# Patient Record
Sex: Male | Born: 1970 | Race: White | Hispanic: No | Marital: Married | Smoking: Former smoker
Health system: Southern US, Community
[De-identification: ages and names within clinical notes are randomized; demographics above are authoritative.]

---

## 2010-01-10 ENCOUNTER — Ambulatory Visit: Payer: Self-pay | Admitting: Internal Medicine

## 2011-10-03 IMAGING — US US RENAL KIDNEY
1 series · 17 of 25 positions shown · non-contrast
Comparison: none

REASON FOR EXAM: hematuria
COMMENTS:

[Series 1: us renal kidney · 17 of 26 slices shown]
[im 1/26]
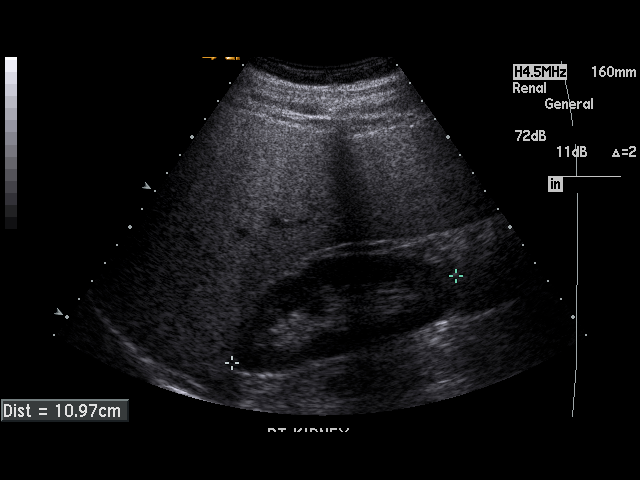
[im 3/26]
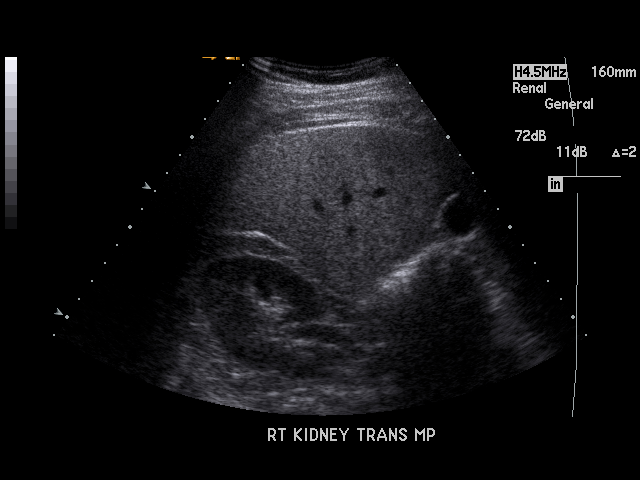
[im 4/26]
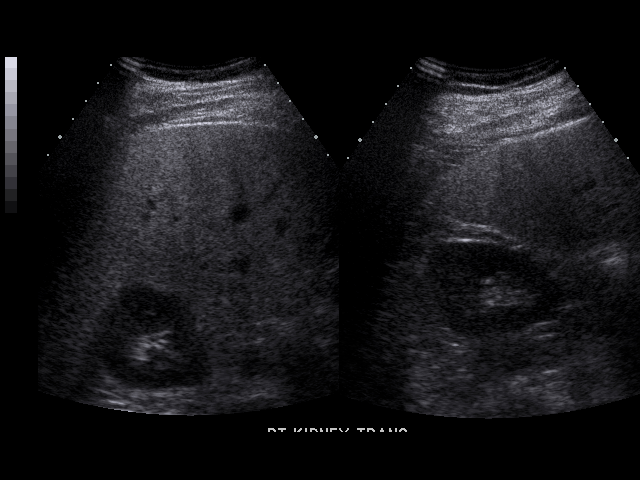
[im 6/26]
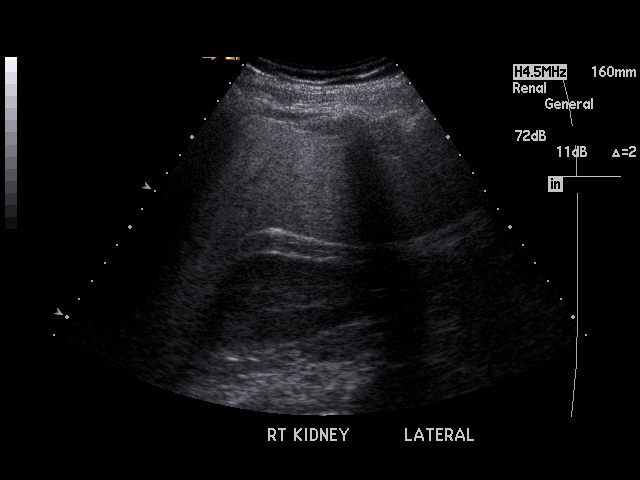
[im 7/26]
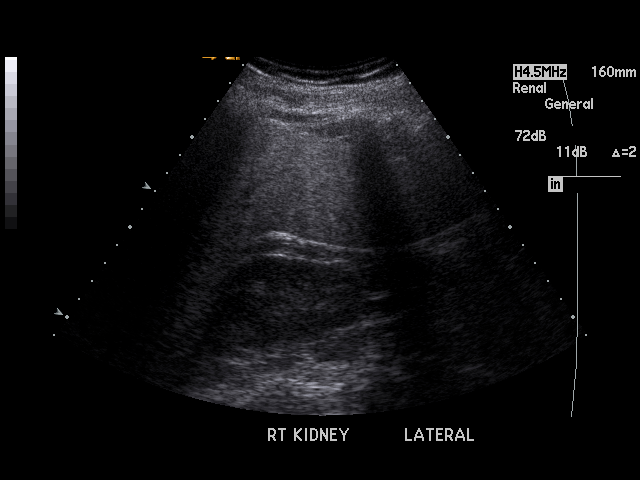
[im 9/26]
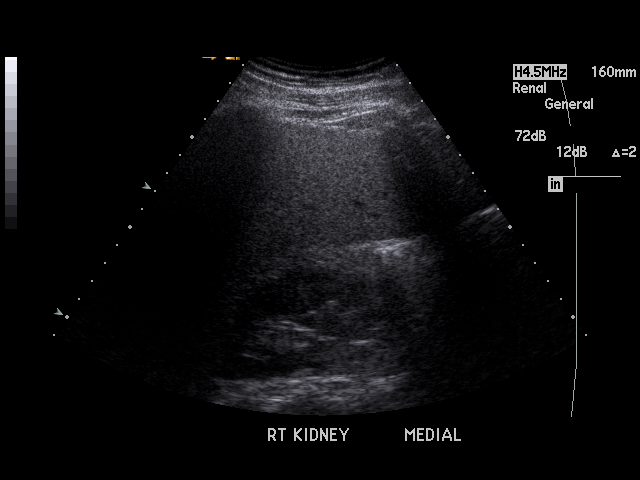
[im 10/26]
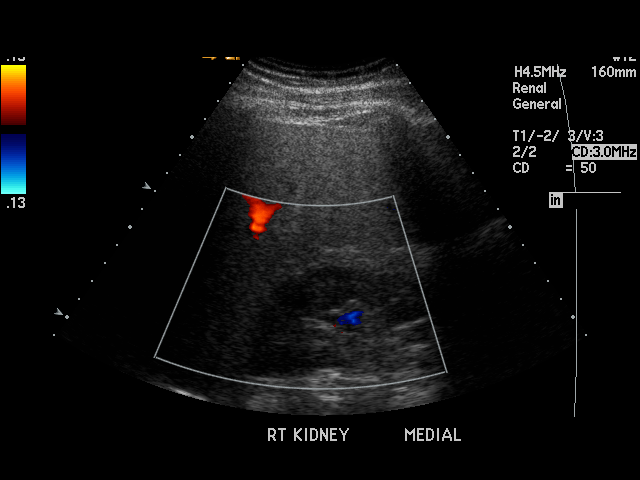
[im 12/26]
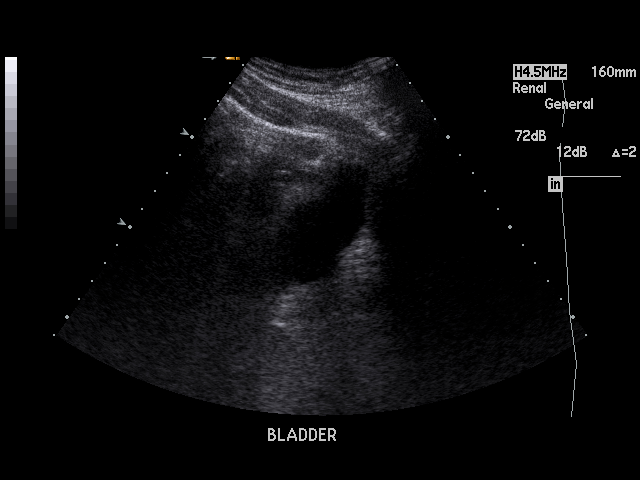
[im 13/26]
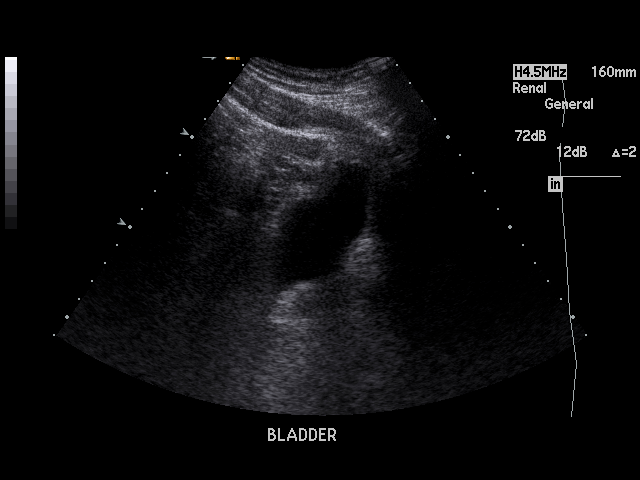
[im 14/26]
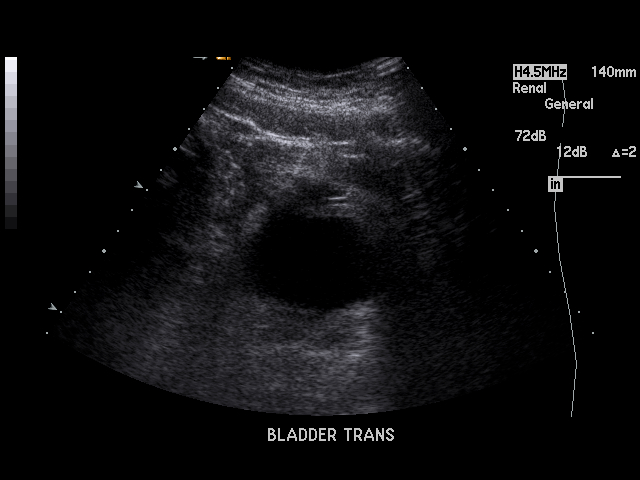
[im 16/26]
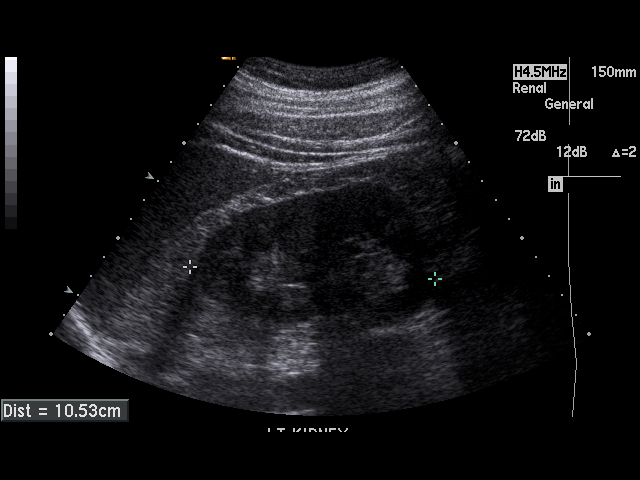
[im 17/26]
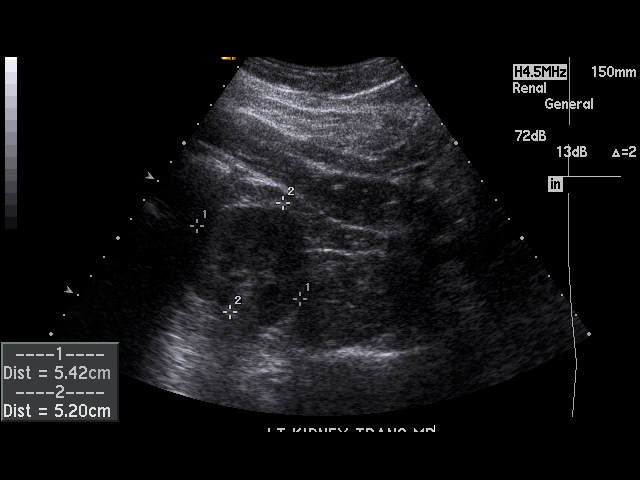
[im 19/26]
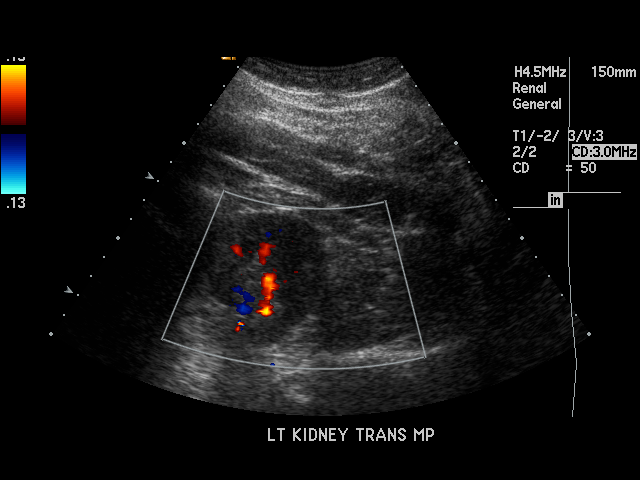
[im 20/26]
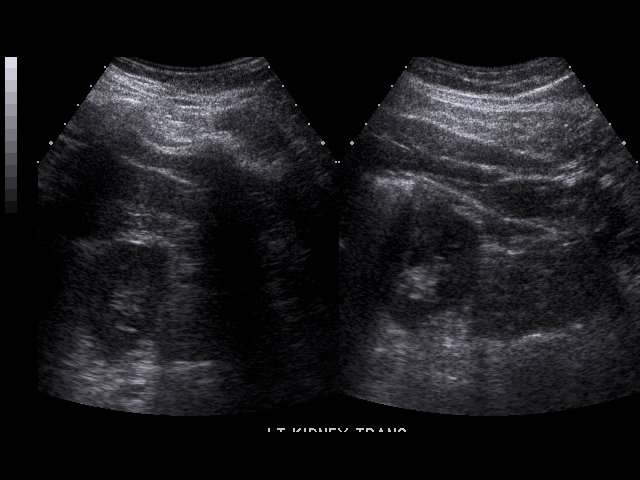
[im 22/26]
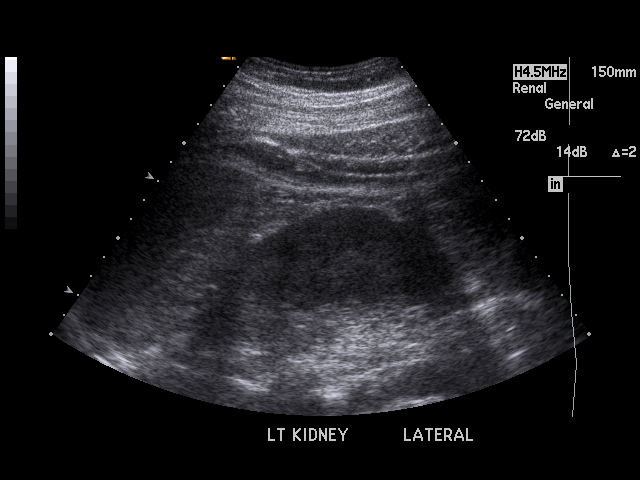
[im 23/26]
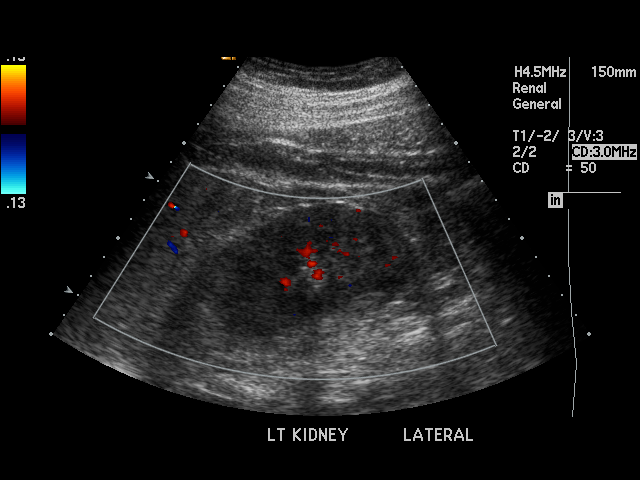
[im 26/26]
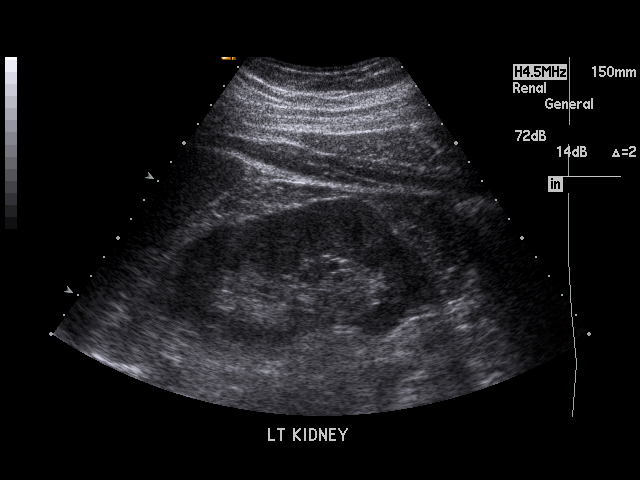

[17 of 25 positions shown; findings below may reference images not displayed]

PROCEDURE:     US  - US KIDNEY  - January 10, 2010  [DATE]

RESULT:     The right kidney measures 10.97 cm x 4.99 cm x 5.86 cm the left
kidney measures 10.53 cm x 5.42 cm 5.2 cm. The renal cortical margins
bilaterally are smooth. No solid or cystic renal mass lesions are seen. No
renal calcifications are noted. There is no hydronephrosis. The visualized
portion of the partially filled urinary bladder reveals no significant
abnormalities.
IMPRESSION: 1.     No significant abnormalities are noted.

## 2012-04-28 ENCOUNTER — Ambulatory Visit: Payer: Self-pay | Admitting: Surgery

## 2012-04-28 LAB — BASIC METABOLIC PANEL
Calcium, Total: 8.8 mg/dL (ref 8.5–10.1)
Co2: 29 mmol/L (ref 21–32)
Creatinine: 1.23 mg/dL (ref 0.60–1.30)
Glucose: 102 mg/dL — ABNORMAL HIGH (ref 65–99)
Osmolality: 277 (ref 275–301)
Potassium: 5 mmol/L (ref 3.5–5.1)
Sodium: 138 mmol/L (ref 136–145)

## 2012-04-28 LAB — CBC
HCT: 40.7 % (ref 40.0–52.0)
HGB: 13.9 g/dL (ref 13.0–18.0)
MCH: 29.8 pg (ref 26.0–34.0)
MCHC: 34.2 g/dL (ref 32.0–36.0)
MCV: 87 fL (ref 80–100)
RBC: 4.68 10*6/uL (ref 4.40–5.90)
RDW: 13.1 % (ref 11.5–14.5)
WBC: 8.7 10*3/uL (ref 3.8–10.6)

## 2012-05-04 ENCOUNTER — Ambulatory Visit: Payer: Self-pay | Admitting: Surgery

## 2013-01-13 ENCOUNTER — Emergency Department: Payer: Self-pay | Admitting: Emergency Medicine

## 2013-01-18 ENCOUNTER — Emergency Department: Payer: Self-pay | Admitting: Emergency Medicine

## 2013-04-16 ENCOUNTER — Ambulatory Visit: Payer: Self-pay

## 2014-10-06 IMAGING — CR MANDIBLE - 1-3 VIEW
1 series · 6 of 6 positions shown · non-contrast
Comparison: none

REASON FOR EXAM: laceration   kicked by a horse  flex 43
COMMENTS:   LMP: (Male)

[Series 1: w mandible pa · 0.14mm/px · 6 of 6 slices shown]
[im 1/6]
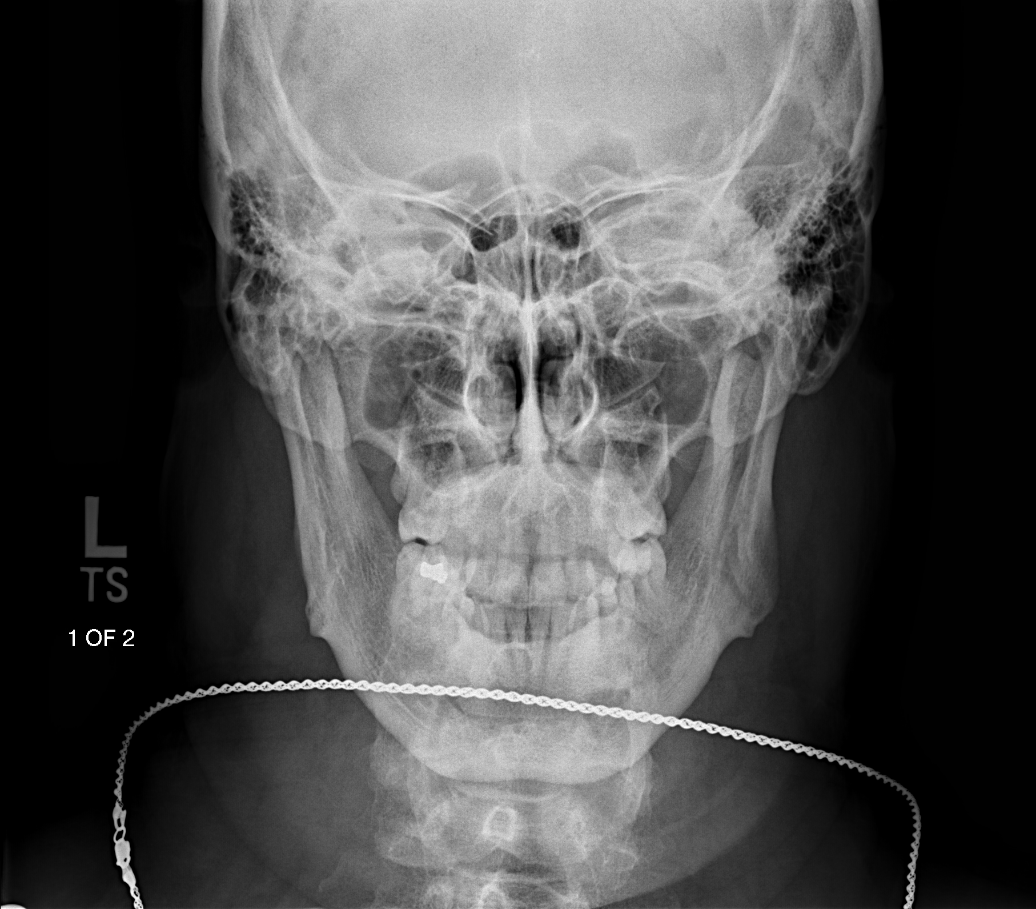
[im 2/6]
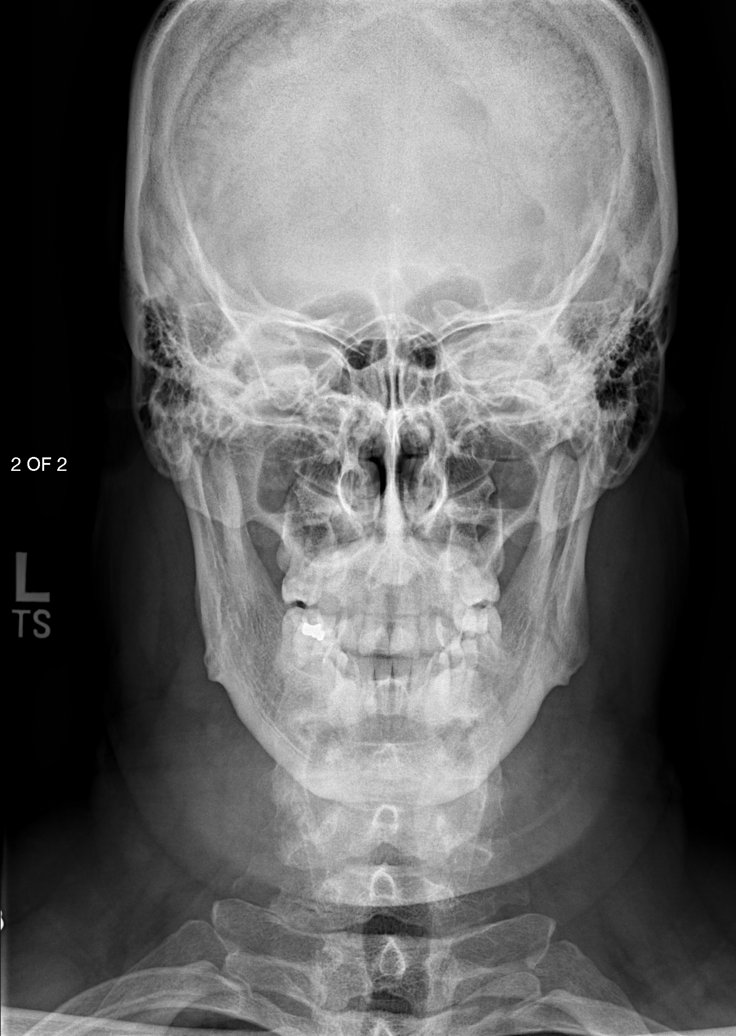
[im 3/6]
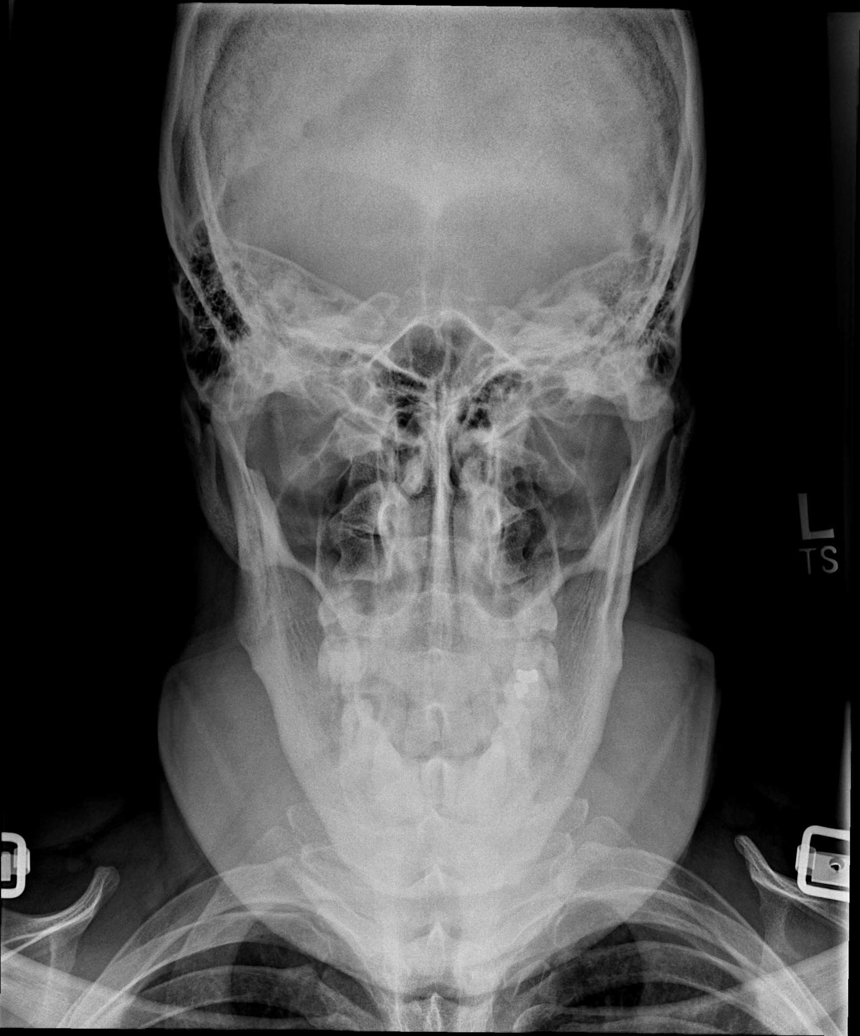
[im 4/6]
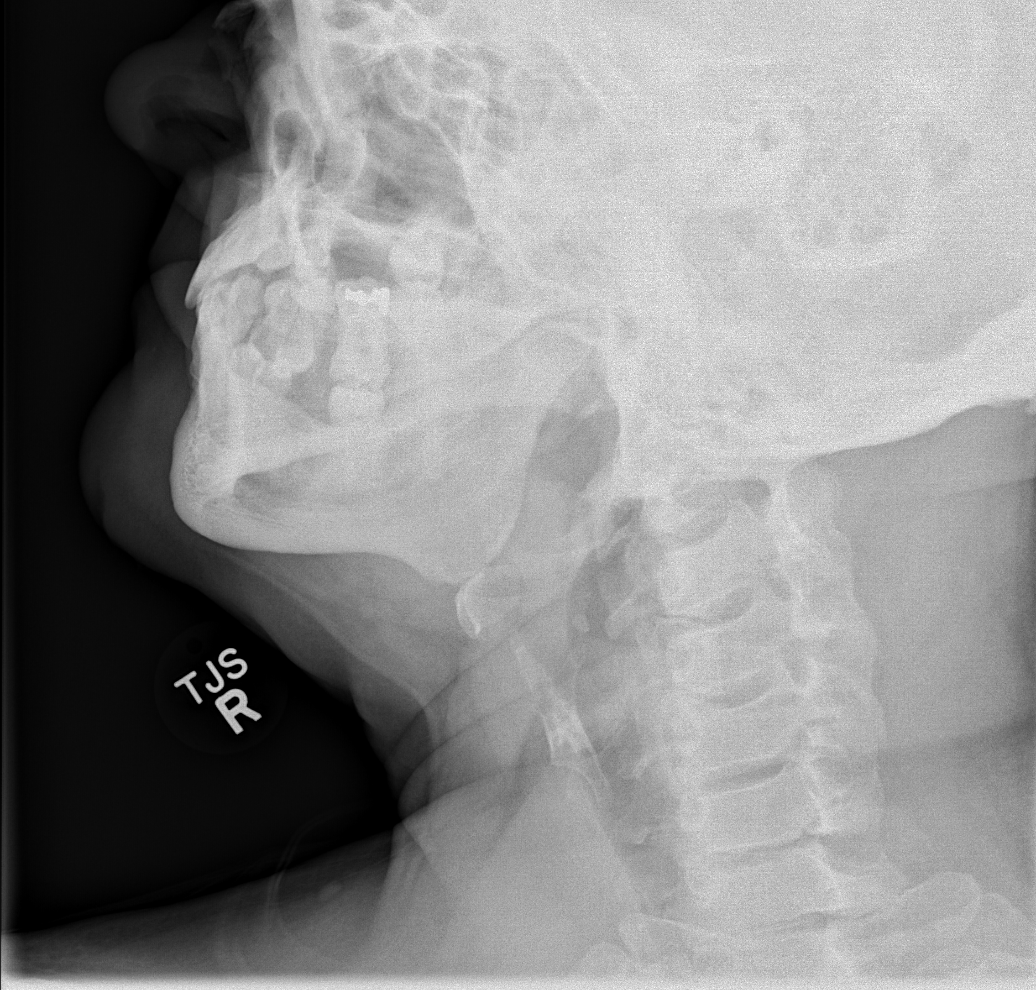
[im 5/6]
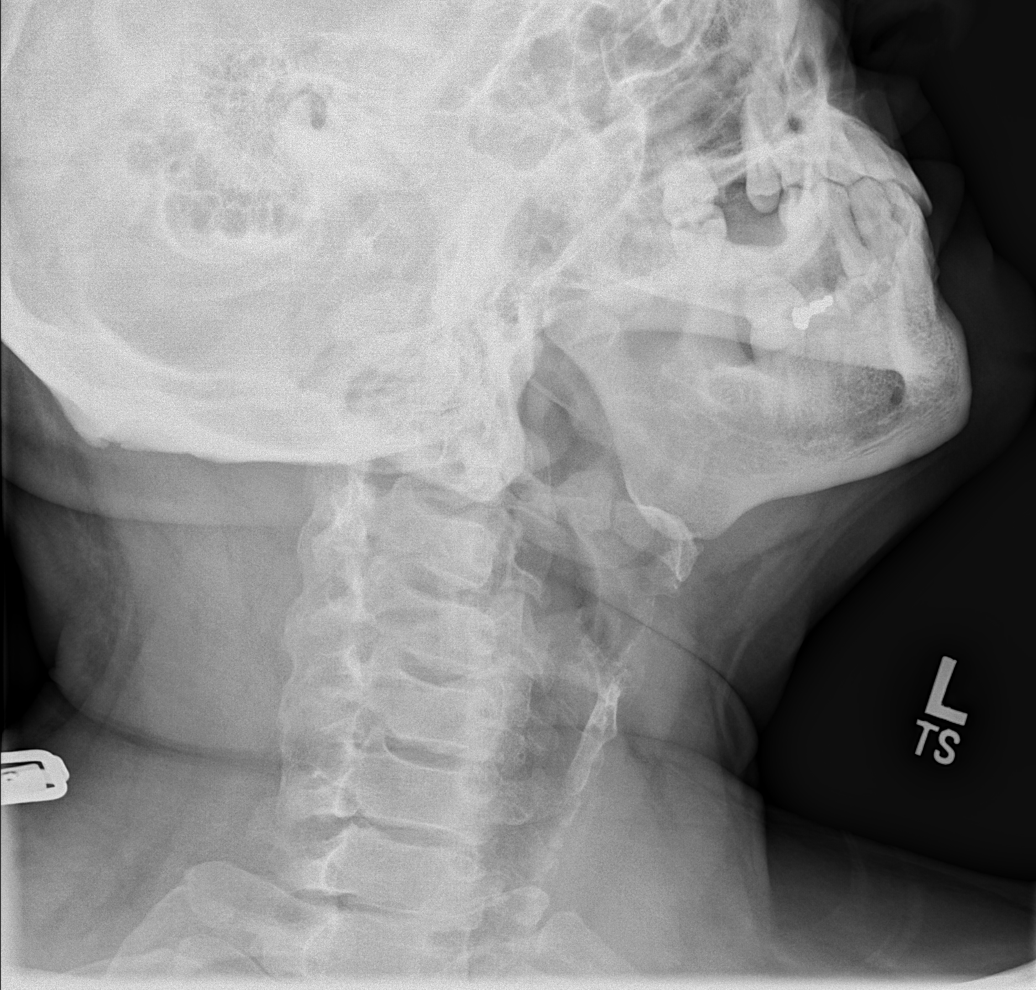
[im 6/6]
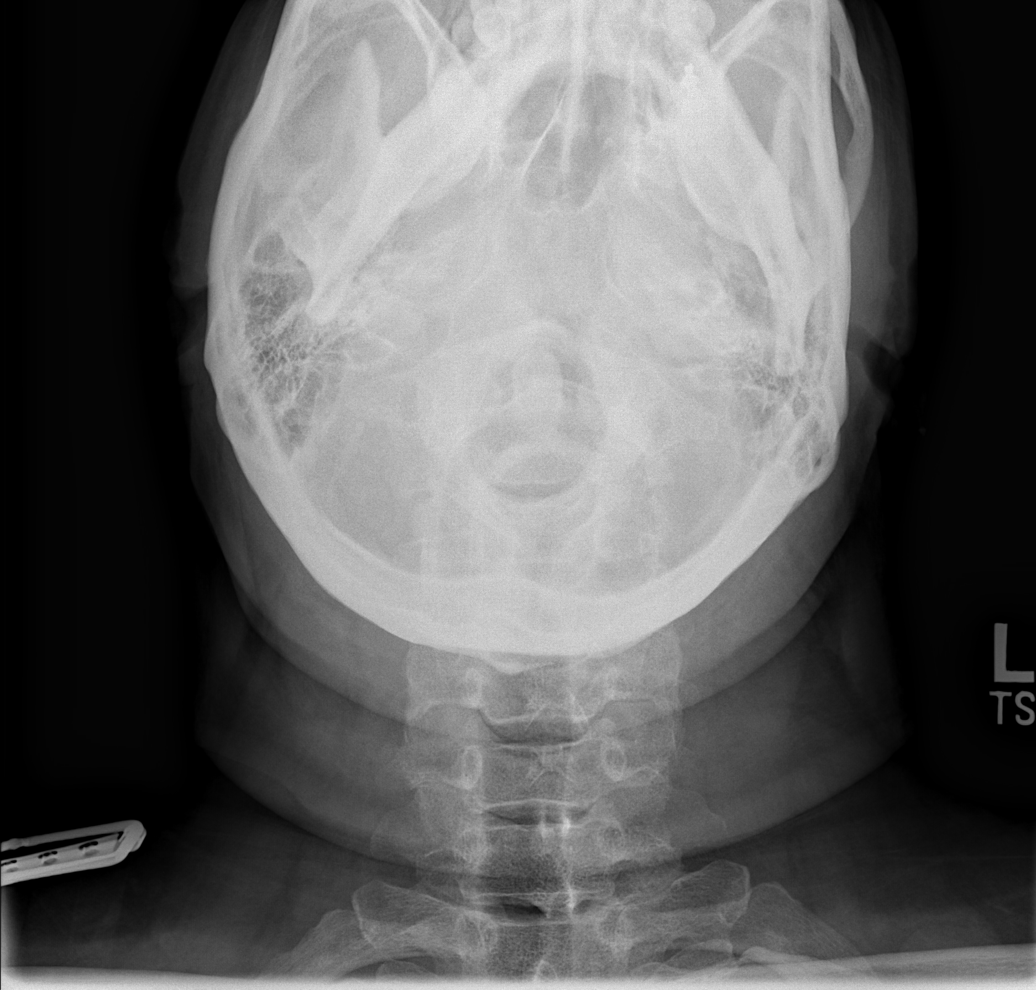

[6 of 6 positions shown; findings below may reference images not displayed]

PROCEDURE:     DXR - DXR MANDIBLE  PARTIAL  - January 13, 2013  [DATE]

RESULT:     Six views of the mandible are submitted. There is subtle lucency
noted involving the lateral aspect of the left mandibular ramus. I do not
see a paramedian fracture. As best as can be determined the mandibular
condyles are intact. On the lateral films no definite mandibular fracture is
demonstrated.
IMPRESSION: No definite mandibular fracture is demonstrated. If there
are strong clinical concerns of an acute mandibular fracture, followup CT
scanning is available upon reque[REDACTED]

## 2014-11-23 DIAGNOSIS — R7989 Other specified abnormal findings of blood chemistry: Secondary | ICD-10-CM | POA: Insufficient documentation

## 2015-09-07 DIAGNOSIS — N029 Recurrent and persistent hematuria with unspecified morphologic changes: Secondary | ICD-10-CM | POA: Insufficient documentation

## 2016-09-09 DIAGNOSIS — G4733 Obstructive sleep apnea (adult) (pediatric): Secondary | ICD-10-CM | POA: Insufficient documentation

## 2016-09-09 DIAGNOSIS — Z9989 Dependence on other enabling machines and devices: Secondary | ICD-10-CM | POA: Insufficient documentation

## 2020-04-13 DIAGNOSIS — G5602 Carpal tunnel syndrome, left upper limb: Secondary | ICD-10-CM | POA: Insufficient documentation

## 2020-04-14 DIAGNOSIS — M65332 Trigger finger, left middle finger: Secondary | ICD-10-CM | POA: Insufficient documentation

## 2021-12-18 ENCOUNTER — Telehealth: Payer: Self-pay | Admitting: *Deleted

## 2021-12-18 NOTE — Telephone Encounter (Signed)
I am calling to let you know that we keep getting a referral from the [place] but it's expired.  It expired on 11/14/2021.  "I've been waiting on a referral for forever with you all.  I'll give them a call.  Thanks for letting me know."

## 2021-12-28 ENCOUNTER — Encounter: Payer: Self-pay | Admitting: Podiatry

## 2021-12-28 ENCOUNTER — Ambulatory Visit (INDEPENDENT_AMBULATORY_CARE_PROVIDER_SITE_OTHER): Payer: No Typology Code available for payment source

## 2021-12-28 ENCOUNTER — Ambulatory Visit (INDEPENDENT_AMBULATORY_CARE_PROVIDER_SITE_OTHER): Payer: No Typology Code available for payment source | Admitting: Podiatry

## 2021-12-28 ENCOUNTER — Other Ambulatory Visit: Payer: Self-pay

## 2021-12-28 DIAGNOSIS — M722 Plantar fascial fibromatosis: Secondary | ICD-10-CM | POA: Diagnosis not present

## 2021-12-28 MED ORDER — MELOXICAM 15 MG PO TABS: 15.0000 mg | ORAL_TABLET | Freq: Every day | ORAL | 0 refills | Status: DC

## 2021-12-28 MED ORDER — MELOXICAM 15 MG PO TABS
15.0000 mg | ORAL_TABLET | Freq: Every day | ORAL | 0 refills | Status: DC
Start: 2021-12-28 — End: 2022-01-03

## 2021-12-28 MED ORDER — METHYLPREDNISOLONE 4 MG PO TBPK: ORAL_TABLET | ORAL | 0 refills | Status: DC

## 2021-12-28 MED ORDER — BETAMETHASONE SOD PHOS & ACET 6 (3-3) MG/ML IJ SUSP
3.0000 mg | Freq: Once | INTRAMUSCULAR | Status: AC
  Administered 2021-12-28: 3 mg via INTRA_ARTICULAR

## 2021-12-28 NOTE — Addendum Note (Signed)
Addended by: Enedina Finner on: 12/28/2021 02:49 PM   Modules accepted: Orders

## 2021-12-28 NOTE — Progress Notes (Signed)
° °  Subjective: 51 y.o. male presenting today as a new patient for evaluation of right heel pain this been going on for about a year now.  Gradual onset.  Denies a history of injury.  He has tried wearing a night splint in the evenings which does help minimally.  He presents for further treatment and evaluation   No past medical history on file. No Known Allergies    Objective: Physical Exam General: The patient is alert and oriented x3 in no acute distress.  Dermatology: Skin is warm, dry and supple bilateral lower extremities. Negative for open lesions or macerations bilateral.   Vascular: Dorsalis Pedis and Posterior Tibial pulses palpable bilateral.  Capillary fill time is immediate to all digits.  Neurological: Epicritic and protective threshold intact bilateral.   Musculoskeletal: Tenderness to palpation to the plantar aspect of the right heel along the plantar fascia. All other joints range of motion within normal limits bilateral. Strength 5/5 in all groups bilateral.   Radiographic exam: Normal osseous mineralization. Joint spaces preserved. No fracture/dislocation/boney destruction. No other soft tissue abnormalities or radiopaque foreign bodies.  Plantar heel spur noted as well as posterior heel spur  Assessment: 1. Plantar fasciitis right  Plan of Care:  1. Patient evaluated. Xrays reviewed.   2. Injection of 0.5cc Celestone soluspan injected into the right plantar fascia  3. Rx for Medrol Dose Pack placed 4. Rx for Meloxicam ordered for patient. 5. Plantar fascial band(s) dispensed 6. Instructed patient regarding therapies and modalities at home to alleviate symptoms.  7.  Continue the night splint 8.  Prescription for custom molded orthotics provided to take to the [place] for approval  9.  Return to clinic in 4 weeks.    *Goes by '[employer]'.  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 Edrick Kins, DPM    [address], [place] [address]                Office [phone]  Fax [phone]

## 2022-01-02 ENCOUNTER — Telehealth: Payer: Self-pay | Admitting: Podiatry

## 2022-01-02 NOTE — Telephone Encounter (Signed)
Patient called stating you sent a RX to [facility], Patient wouyld like it sent over to the [facility].  ?

## 2022-01-03 MED ORDER — METHYLPREDNISOLONE 4 MG PO TBPK: ORAL_TABLET | ORAL | 0 refills | Status: AC

## 2022-01-03 MED ORDER — MELOXICAM 15 MG PO TABS: 15.0000 mg | ORAL_TABLET | Freq: Every day | ORAL | 0 refills | Status: AC

## 2022-01-03 NOTE — Telephone Encounter (Signed)
Prescriptions have been printed and faxed to [facility] ?

## 2022-01-03 NOTE — Addendum Note (Signed)
Addended by: Geraldine Contras D on: 01/03/2022 03:42 PM ? ? Modules accepted: Orders ? ?

## 2022-01-25 ENCOUNTER — Ambulatory Visit (INDEPENDENT_AMBULATORY_CARE_PROVIDER_SITE_OTHER): Payer: No Typology Code available for payment source | Admitting: Podiatry

## 2022-01-25 ENCOUNTER — Other Ambulatory Visit: Payer: Self-pay

## 2022-01-25 ENCOUNTER — Encounter: Payer: Self-pay | Admitting: Podiatry

## 2022-01-25 DIAGNOSIS — M722 Plantar fascial fibromatosis: Secondary | ICD-10-CM | POA: Diagnosis not present

## 2022-01-25 NOTE — Progress Notes (Signed)
? ?  Subjective: ?51 y.o. male presenting today for follow-up evaluation of right heel pain this been going on for about a year now.  He says that he is significantly improved.  He no longer has any pain associated to the heel.  No new complaints at this time. ? ? ?No past medical history on file. ?No Known Allergies ? ? ? ?Objective: ?Physical Exam ?General: The patient is alert and oriented x3 in no acute distress. ? ?Dermatology: Skin is warm, dry and supple bilateral lower extremities. Negative for open lesions or macerations bilateral.  ? ?Vascular: Dorsalis Pedis and Posterior Tibial pulses palpable bilateral.  Capillary fill time is immediate to all digits. ? ?Neurological: Epicritic and protective threshold intact bilateral.  ? ?Musculoskeletal: Negative for any significant tenderness to palpation to the plantar aspect of the right heel along the plantar fascia. All other joints range of motion within normal limits bilateral. Strength 5/5 in all groups bilateral.  ? ?Assessment: ?1. Plantar fasciitis right ? ?Plan of Care:  ?1. Patient evaluated.  ?2.  Overall the patient is doing very well.  No injections administered today ?3.  Continue meloxicam 15 mg daily as needed ?4.  Continue plantar fascial brace and night splint as needed ?5.  Return to clinic as needed ? ?*Goes by '[employer]'.  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  His son is on the[employer] team of his school.  ? ? ?Edrick Kins, DPM ?[place] ? ?Dr. Edrick Kins, DPM  ?  ?[address]                                        ?[place], [place] [address]                ?Office [phone]  ?Fax [phone] ? ? ? ? ?

## 2023-07-10 ENCOUNTER — Other Ambulatory Visit: Payer: Self-pay
